# Patient Record
Sex: Male | Born: 2000 | Race: White | Hispanic: No | Marital: Single | Smoking: Never smoker
Health system: Southern US, Community
[De-identification: ages and names within clinical notes are randomized; demographics above are authoritative.]

## PROBLEM LIST (undated history)

## (undated) DIAGNOSIS — J45909 Unspecified asthma, uncomplicated: Secondary | ICD-10-CM

---
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
## 2018-08-03 IMAGING — DX DG KNEE AP/LAT W/ SUNRISE*R*
3 series · 3 of 3 positions shown · non-contrast
Comparison: None.

CLINICAL DATA: Pain in swelling

EXAM:
RIGHT KNEE 3 VIEWS

[knee ap (1 of 2)]
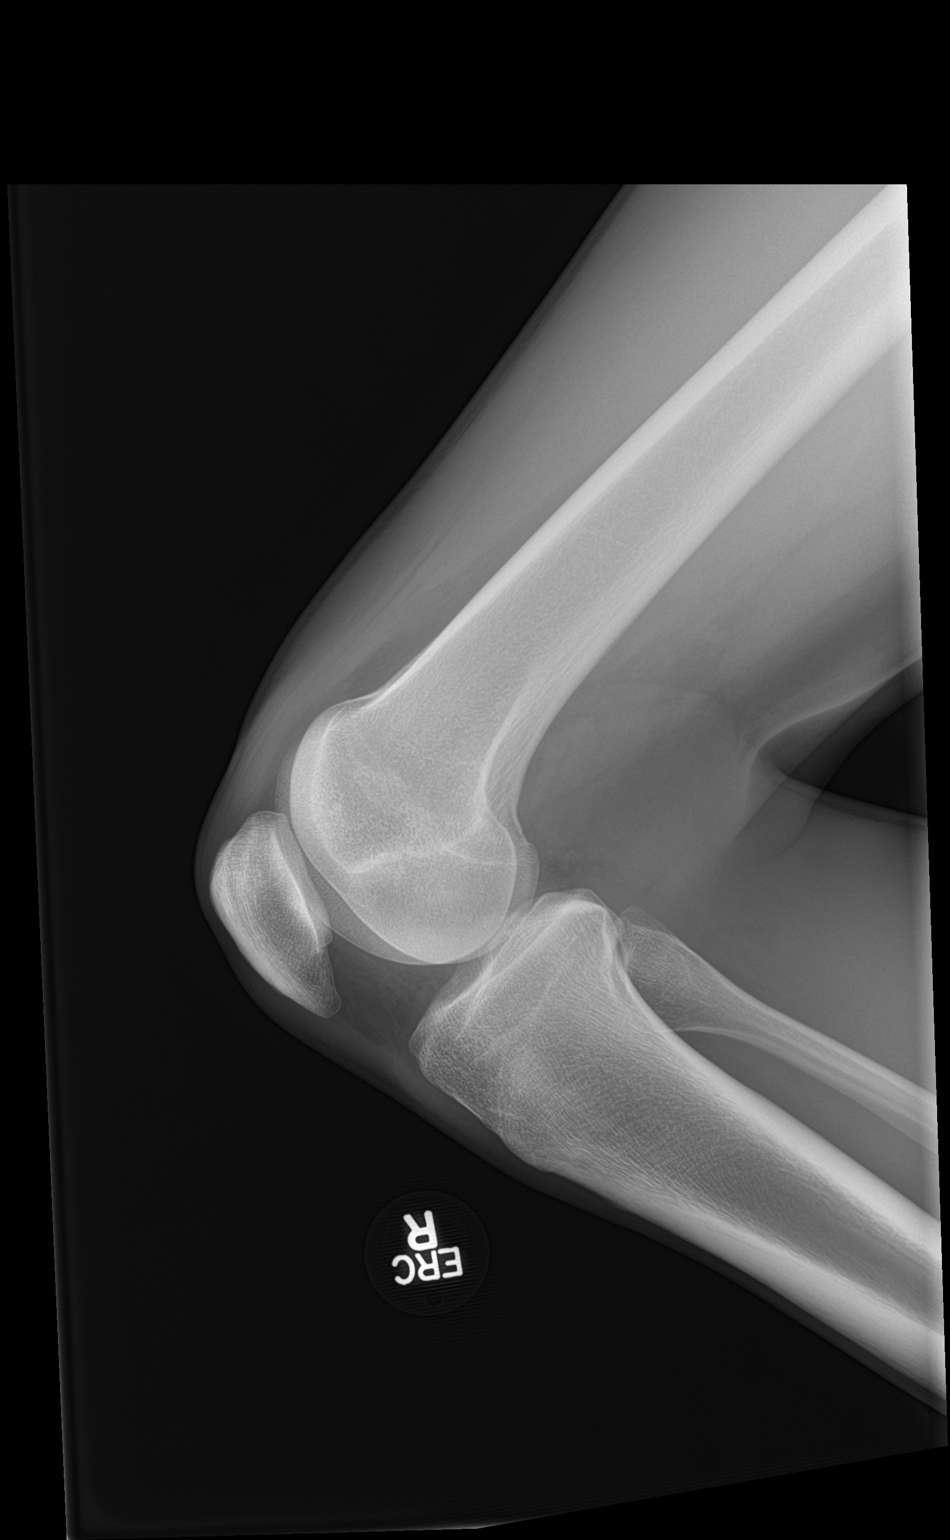

[patella skyline]
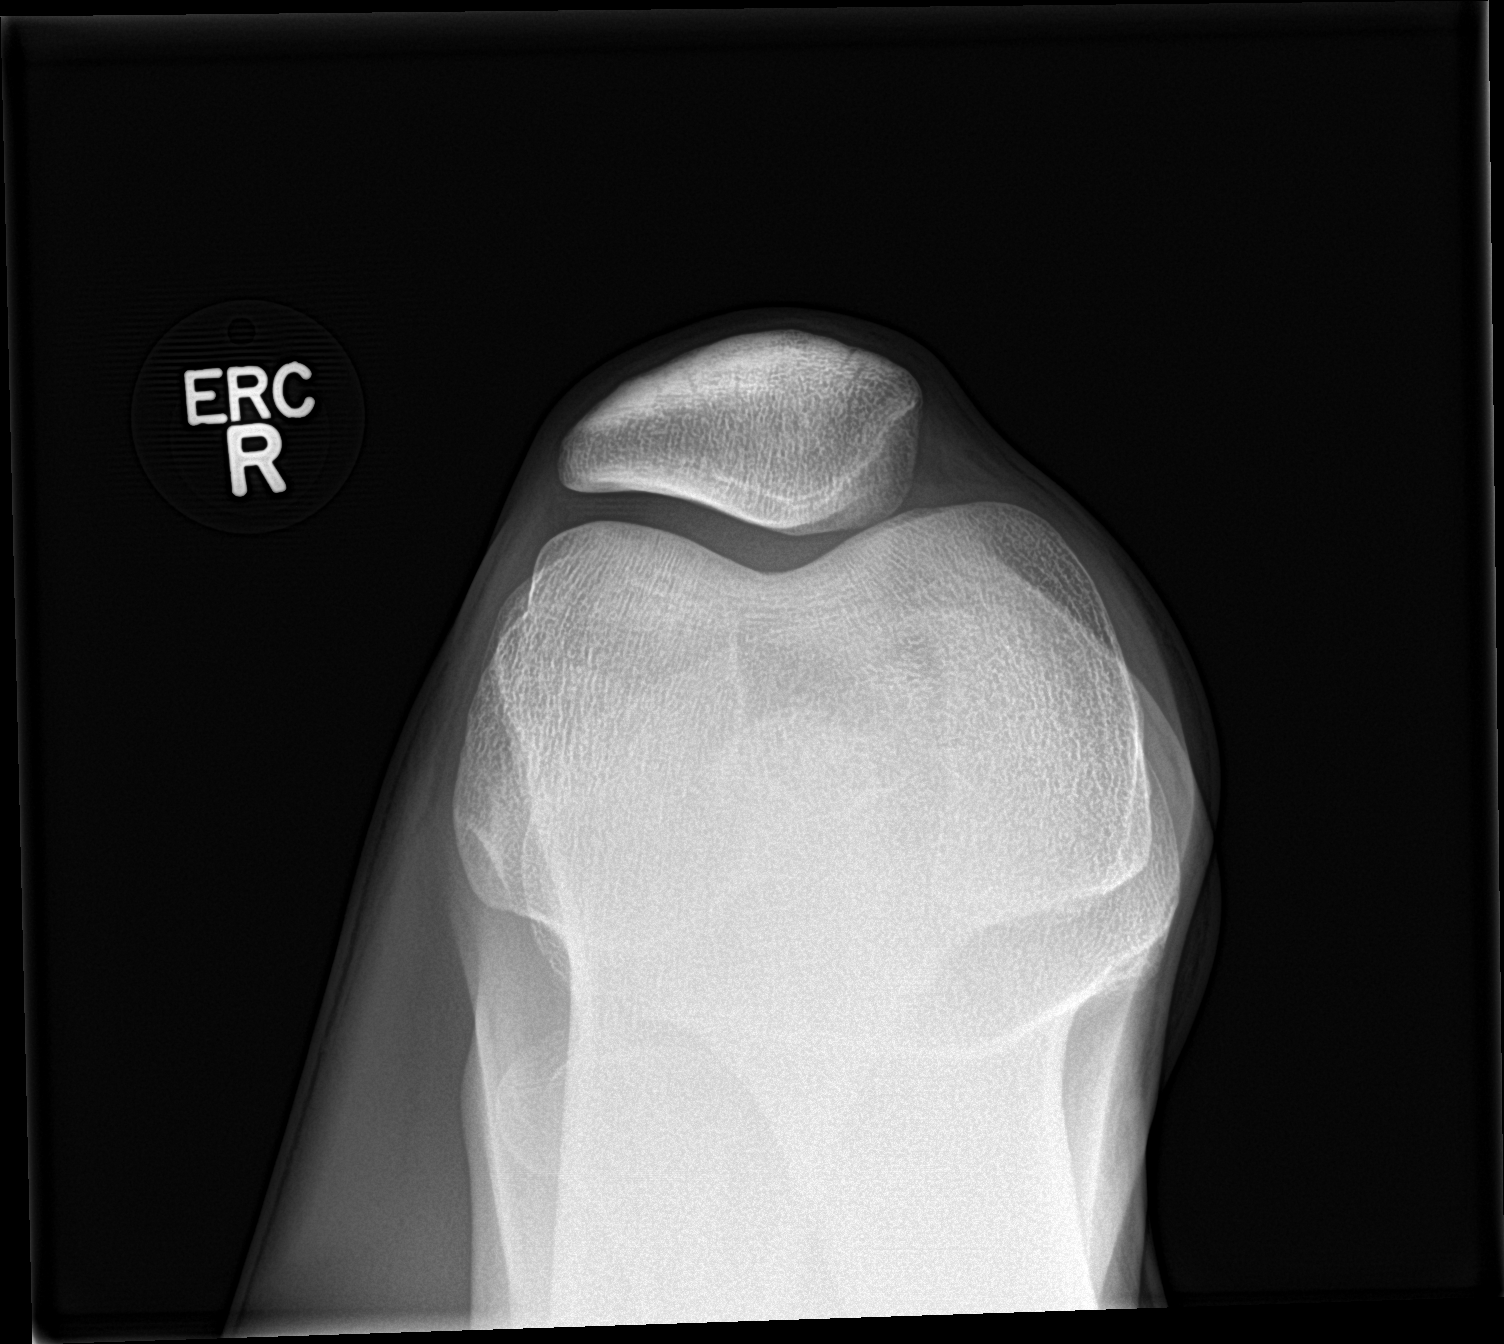

[knee ap (2 of 2)]
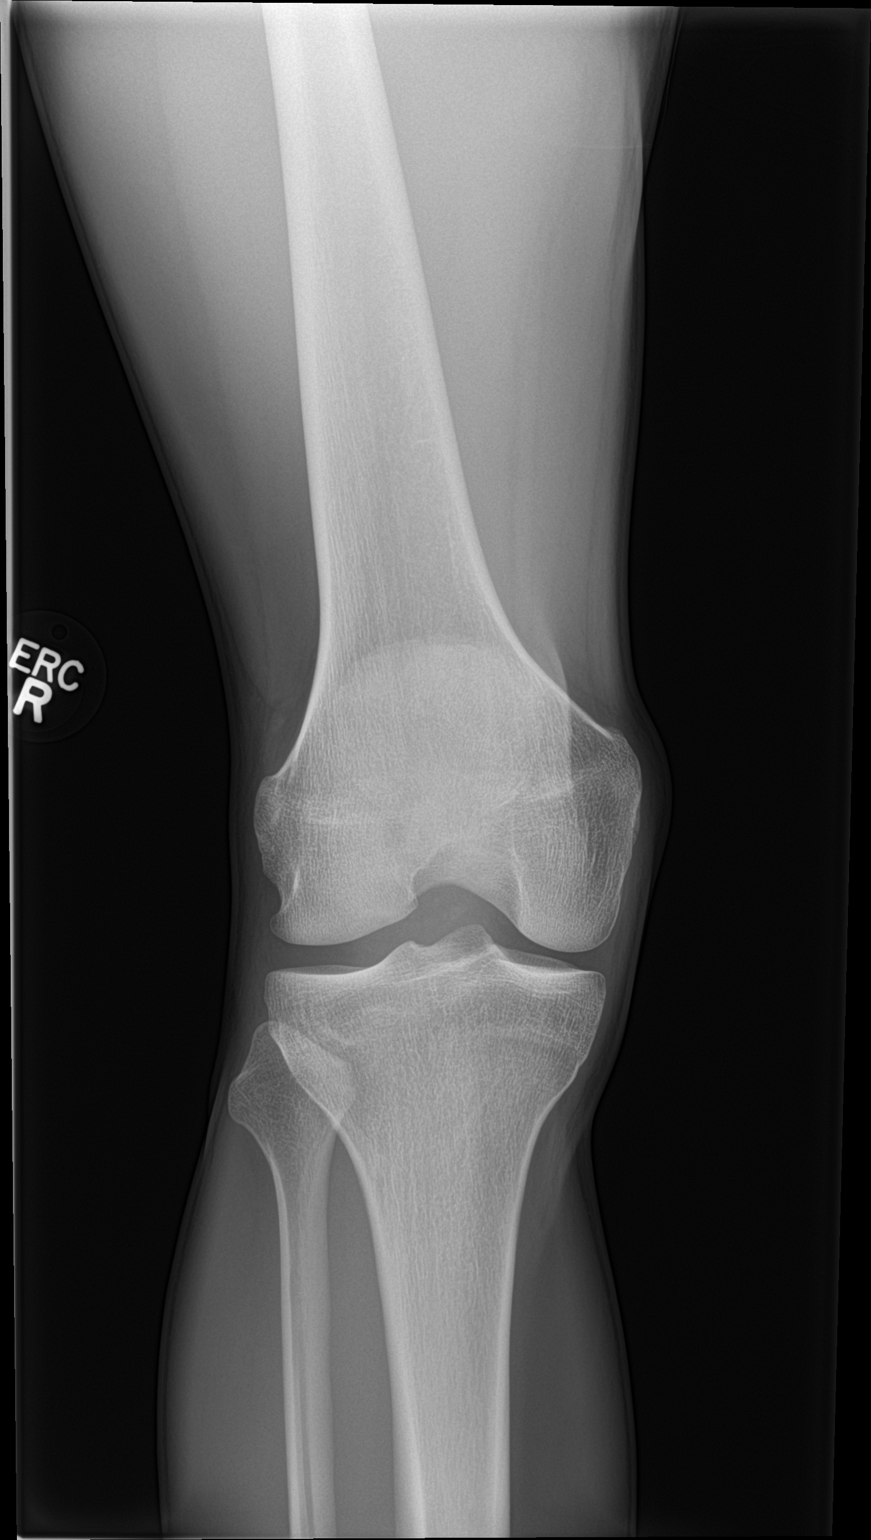

[3 of 3 positions shown; findings below may reference images not displayed]

FINDINGS: Frontal, lateral, and sunrise patellar images were obtained. There
is no fracture or dislocation. No joint effusion. Joint spaces
appear normal. No erosive change.
IMPRESSION: No fracture or joint effusion.  No appreciable arthropathy.

## 2018-11-16 ENCOUNTER — Encounter: Payer: Self-pay | Admitting: Emergency Medicine

## 2018-11-16 ENCOUNTER — Ambulatory Visit
Admission: EM | Admit: 2018-11-16 | Discharge: 2018-11-16 | Disposition: A | Discharge status: Home / Self Care | Payer: Medicaid Other | Attending: Urgent Care | Admitting: Urgent Care

## 2018-11-16 ENCOUNTER — Other Ambulatory Visit: Payer: Self-pay

## 2018-11-16 DIAGNOSIS — R1084 Generalized abdominal pain: Secondary | ICD-10-CM | POA: Diagnosis present

## 2018-11-16 DIAGNOSIS — K529 Noninfective gastroenteritis and colitis, unspecified: Secondary | ICD-10-CM | POA: Insufficient documentation

## 2018-11-16 LAB — COMPREHENSIVE METABOLIC PANEL
ALT: 53 U/L — ABNORMAL HIGH (ref 0–44)
AST: 35 U/L (ref 15–41)
Albumin: 4.5 g/dL (ref 3.5–5.0)
Alkaline Phosphatase: 59 U/L (ref 38–126)
Anion gap: 9 (ref 5–15)
BUN: 17 mg/dL (ref 6–20)
CO2: 23 mmol/L (ref 22–32)
Calcium: 9.5 mg/dL (ref 8.9–10.3)
Chloride: 104 mmol/L (ref 98–111)
Creatinine, Ser: 0.9 mg/dL (ref 0.61–1.24)
GFR calc Af Amer: >60 mL/min (ref >60)
GFR calc non Af Amer: >60 mL/min (ref >60)
Glucose, Bld: 106 mg/dL — ABNORMAL HIGH (ref 70–99)
Potassium: 3.8 mmol/L (ref 3.5–5.1)
Sodium: 136 mmol/L (ref 135–145)
Total Bilirubin: 1.7 mg/dL — ABNORMAL HIGH (ref 0.3–1.2)
Total Protein: 7.8 g/dL (ref 6.5–8.1)

## 2018-11-16 LAB — URINALYSIS, COMPLETE (UACMP) WITH MICROSCOPIC
Bacteria, UA: NONE SEEN
Bilirubin Urine: NEGATIVE
Glucose, UA: NEGATIVE mg/dL
Hgb urine dipstick: NEGATIVE
Leukocytes,Ua: NEGATIVE
Nitrite: NEGATIVE
Protein, ur: NEGATIVE mg/dL
RBC / HPF: NONE SEEN RBC/hpf (ref 0–5)
Specific Gravity, Urine: 1.02 (ref 1.005–1.030)
WBC, UA: NONE SEEN WBC/hpf (ref 0–5)
pH: 7 (ref 5.0–8.0)

## 2018-11-16 LAB — CBC
HCT: 43.5 % (ref 39.0–52.0)
Hemoglobin: 15.1 g/dL (ref 13.0–17.0)
MCH: 31 pg (ref 26.0–34.0)
MCHC: 34.7 g/dL (ref 30.0–36.0)
MCV: 89.3 fL (ref 80.0–100.0)
Platelets: 168 10*3/uL (ref 150–400)
RBC: 4.87 MIL/uL (ref 4.22–5.81)
RDW: 11.9 % (ref 11.5–15.5)
WBC: 9.2 10*3/uL (ref 4.0–10.5)
nRBC: 0 % (ref 0.0–0.2)

## 2018-11-16 LAB — LIPASE, BLOOD: Lipase: 42 U/L (ref 11–51)

## 2018-11-16 MED ORDER — ONDANSETRON 4 MG PO TBDP
4.0000 mg | ORAL_TABLET | Freq: Once | ORAL | Status: AC
  Administered 2018-11-16: 09:00:00 4 mg via ORAL

## 2018-11-16 MED ORDER — ONDANSETRON 4 MG PO TBDP: 4.0000 mg | ORAL_TABLET | Freq: Three times a day (TID) | ORAL | 0 refills | Status: AC | PRN

## 2018-11-16 NOTE — ED Triage Notes (Addendum)
Patient c/o nausea that started yesterday and states he feels like he needs to have a bowel movement. His last BM was this morning and he reports it was diarrhea. He is c/o abdominal pain. He states he feels like he has an eating disorder.

## 2018-11-16 NOTE — ED Provider Notes (Signed)
[place], [place]   Name: Damon FinlandDarynn J Entsminger DOB: 2001-01-15 MRN: [id number] CSN: [id number] PCP: Patient, No Pcp Per  Arrival date and time:  11/16/18 0810  Chief Complaint:  Abdominal Pain   NOTE: Prior to seeing the patient today, I have reviewed the triage nursing documentation and vital signs. Clinical staff has updated patient's PMH/PSHx, current medication list, and drug allergies/intolerances to ensure comprehensive history available to assist in medical decision making.   History:   HPI: Damon Hicks is a 18 y.o. male who presents today with complaints of diffuse abdominal pain that began yesterday. Pain is describes has sharp in nature and comes in "waves". Patient complains of associated nausea, however he has not vomited. He reports 2 loose stools yesterday and 1 today. Patient denies any fevers, but notes that he has had episodes of chills. He has not experienced any urinary symptoms. Oral intake has been decreased due to the aforementioned symptoms. Patient denies any contacts with anyone known to have similar symptoms. Despite his symptoms, patient has not taken any over the counter interventions to help improve/relieve his reported symptoms at home. Patient does not have a PCP; "I don't go to doctor's or take medicine usually". Patient very anxious upon arrival today. He states, "I am not trying to be disrespectful at all, but how long is this going to take. I felt so much better outside"; he is observed trembling.   Past Medical History:  Diagnosis Date  . Asthma     History reviewed. No pertinent surgical history.  Family History  Problem Relation Age of Onset  . Healthy Mother     Social History   Tobacco Use  . Smoking status: Never Smoker  . Smokeless tobacco: Never Used  Substance Use Topics  . Alcohol use: No  . Drug use: Yes    Types: Marijuana    Comment: last use last night     There are no active problems to display for this patient.   Home Medications:     No outpatient medications have been marked as taking for the 11/16/18 encounter [facility]Hospital Encounter).    Allergies:   Codeine  Review of Systems (ROS): Review of Systems  Constitutional: Positive for chills. Negative for fever.  HENT: Negative for congestion, rhinorrhea, sinus pain and sore throat.   Respiratory: Negative for cough and shortness of breath.   Cardiovascular: Negative for chest pain and palpitations.  Gastrointestinal: Positive for abdominal pain, diarrhea and nausea. Negative for abdominal distention and vomiting.  Genitourinary: Negative for discharge, dysuria, frequency, penile pain and urgency.  Musculoskeletal: Negative for back pain and myalgias.  Skin: Negative for color change, pallor and rash.  Neurological: Negative for dizziness, syncope and headaches.  Psychiatric/Behavioral: The patient is nervous/anxious.   All other systems reviewed and are negative.    Vital Signs: Today's Vitals   11/16/18 0833 11/16/18 0835  BP:  104/76  Pulse:  60  Resp:  18  Temp:  98.4 F (36.9 C)  TempSrc:  Oral  SpO2:  99%  Weight: 142 lb (64.4 kg)   Height: 6' (1.829 m)   PainSc: 8      Physical Exam: Physical Exam  Constitutional: He is oriented to person, place, and time and well-developed, well-nourished, and in no distress. He appears not dehydrated.  HENT:  Head: Normocephalic and atraumatic.  Nose: Nose normal.  Mouth/Throat: Uvula is midline, oropharynx is clear and moist and mucous membranes are normal.  Eyes: Pupils are equal, round, and  reactive to light. EOM are normal.  Neck: Normal range of motion. Neck supple. No tracheal deviation present.  Cardiovascular: Normal rate, regular rhythm, normal heart sounds and intact distal pulses. Exam reveals no gallop and no friction rub.  No murmur heard. Pulmonary/Chest: Effort normal and breath sounds normal. No respiratory distress. He has no decreased breath sounds. He has no wheezes. He has no rhonchi. He  has no rales.  Abdominal: Soft. Normal appearance and bowel sounds are normal. There is no hepatosplenomegaly. There is generalized abdominal tenderness. There is no CVA tenderness.  Lymphadenopathy:    He has no cervical adenopathy.  Neurological: He is alert and oriented to person, place, and time. Gait normal. GCS score is 15.  Skin: Skin is warm and dry. No rash noted. He is not diaphoretic.  Psychiatric: Memory, affect and judgment normal. His mood appears anxious.  Nursing note and vitals reviewed.   Urgent Care Treatments / Results:   LABS: PLEASE NOTE: all labs that were ordered this encounter are listed, however only abnormal results are displayed. Labs Reviewed  URINALYSIS, COMPLETE (UACMP) WITH MICROSCOPIC - Abnormal; Notable for the following components:      Result Value   Ketones, ur TRACE (*)    All other components within normal limits  COMPREHENSIVE METABOLIC PANEL - Abnormal; Notable for the following components:   Glucose, Bld 106 (*)    ALT 53 (*)    Total Bilirubin 1.7 (*)    All other components within normal limits  CBC  LIPASE, BLOOD    EKG: -None  RADIOLOGY: No results found.  PROCEDURES: Procedures  MEDICATIONS RECEIVED THIS VISIT: Medications  ondansetron (ZOFRAN-ODT) disintegrating tablet 4 mg (4 mg Oral Given 11/16/18 0853)    PERTINENT CLINICAL COURSE NOTES/UPDATES:   Initial Impression / Assessment and Plan / Urgent Care Course:  Pertinent labs & imaging results that were available during my care of the patient were personally reviewed by me and considered in my medical decision making (see lab/imaging section of note for values and interpretations).  MACIEJ DILLOW is a 19 y.o. male who presents to [facility] today with complaints of abdominal pain with associated nausea.   Patient is well appearing overall in clinic today. He does not appear to be in any acute distress. Presenting symptoms (see HPI) and exam as documented above.  Patient improved following a single dose of ondansetron 4 mg ODT. Symptoms consistent with AVGE. Dicussed supportive care measures at home during acute phase of illness. Patient to rest as much as possible. He was encouraged to ensure adequate hydration (water and ORS) to prevent dehydration and electrolyte derangements. Patient may use APAP and/or IBU on an as needed basis for pain/fever. Will send in a supply of ondansetron for PRN use.   Patient encouraged to establish care with a PCP for routine health maintenance. Referral sent to [facility] for patient to be seen by Dr. Elizabeth Sauer. Dr. Yetta Barre' office graciously provided an appointment for patient to be seen to establish care on 12/01/2018. He was advised that Medicaid card would have to be changed from [facility] to Dr. Yetta Barre in order for him to be seen. Patient to contact [place] Medicaid social worker today to start process.   I have reviewed the follow up and strict return precautions for any new or worsening symptoms. Patient is aware of symptoms that would be deemed urgent/emergent, and would thus require further evaluation either here or in the emergency department. At the time  of discharge, he verbalized understanding and consent with the discharge plan as it was reviewed with him. All questions were fielded by provider and/or clinic staff prior to patient discharge.    Final Clinical Impressions / Urgent Care Diagnoses:   Final diagnoses:  Generalized abdominal pain  Gastroenteritis    New Prescriptions:  [place] Controlled Substance Registry consulted? Not Applicable  Meds ordered this encounter  Medications  . ondansetron (ZOFRAN-ODT) disintegrating tablet 4 mg  . ondansetron (ZOFRAN-ODT) 4 MG disintegrating tablet    Sig: Take 1 tablet (4 mg total) by mouth every 8 (eight) hours as needed.    Dispense:  15 tablet    Refill:  0    Recommended Follow up Care:  Patient encouraged to follow up with the following provider  within the specified time frame, or sooner as dictated by the severity of his symptoms. As always, he was instructed that for any urgent/emergent care needs, he should seek care either here or in the emergency department for more immediate evaluation.  Follow-up Information    Call  Juline Patch, MD.   Specialty: Family Medicine Why: Need to establish primary care Contact information: [address] [place] [address] [phone]         NOTE: This note was prepared using Dragon dictation software along with smaller phrase technology. Despite my best ability to proofread, there is the potential that transcriptional errors may still occur from this process, and are completely unintentional.    Karen Kitchens, NP 11/16/18 1448

## 2018-11-16 NOTE — Discharge Instructions (Addendum)
It was very nice seeing you today in clinic. Thank you for entrusting me with your care.   Please utilize the medications that we discussed. Your prescriptions have been called in to your pharmacy. Increase fluid intake as much as possible. Water is always best, as sugar and caffeine containing fluids can cause you to become dehydrated. Try to incorporate electrolyte enriched fluids, such as Gatorade or Pedialyte, into your daily fluid intake.   If your symptoms/condition worsens, please seek follow up care either here or in the ER. Please remember, our [place] providers are "right here with you" when you need [place].   Again, it was my pleasure to take care of you today. Thank you for choosing our clinic. I hope that you start to feel better quickly.   Honor Loh, MSN, APRN, FNP-C, CEN Advanced Practice Provider [facility]

## 2018-11-23 ENCOUNTER — Other Ambulatory Visit: Payer: Self-pay

## 2018-11-23 DIAGNOSIS — Z20822 Contact with and (suspected) exposure to covid-19: Secondary | ICD-10-CM

## 2018-11-24 LAB — NOVEL CORONAVIRUS, NAA: SARS-CoV-2, NAA: NOT DETECTED

## 2018-12-01 ENCOUNTER — Ambulatory Visit: Payer: Medicaid Other | Admitting: Family Medicine
